# Patient Record
Sex: Male | Born: 2000 | Race: White | Hispanic: No | Marital: Single | Smoking: Never smoker
Health system: Southern US, Community
[De-identification: ages and names within clinical notes are randomized; demographics above are authoritative.]

---
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
## 2018-06-22 IMAGING — DX DG HAND COMPLETE 3+V*R*
3 series · 3 of 3 positions shown · non-contrast
Comparison: None.

CLINICAL DATA: Patient fell downstairs injuring right hand.

EXAM:
RIGHT HAND - COMPLETE 3+ VIEW

[hand pa]
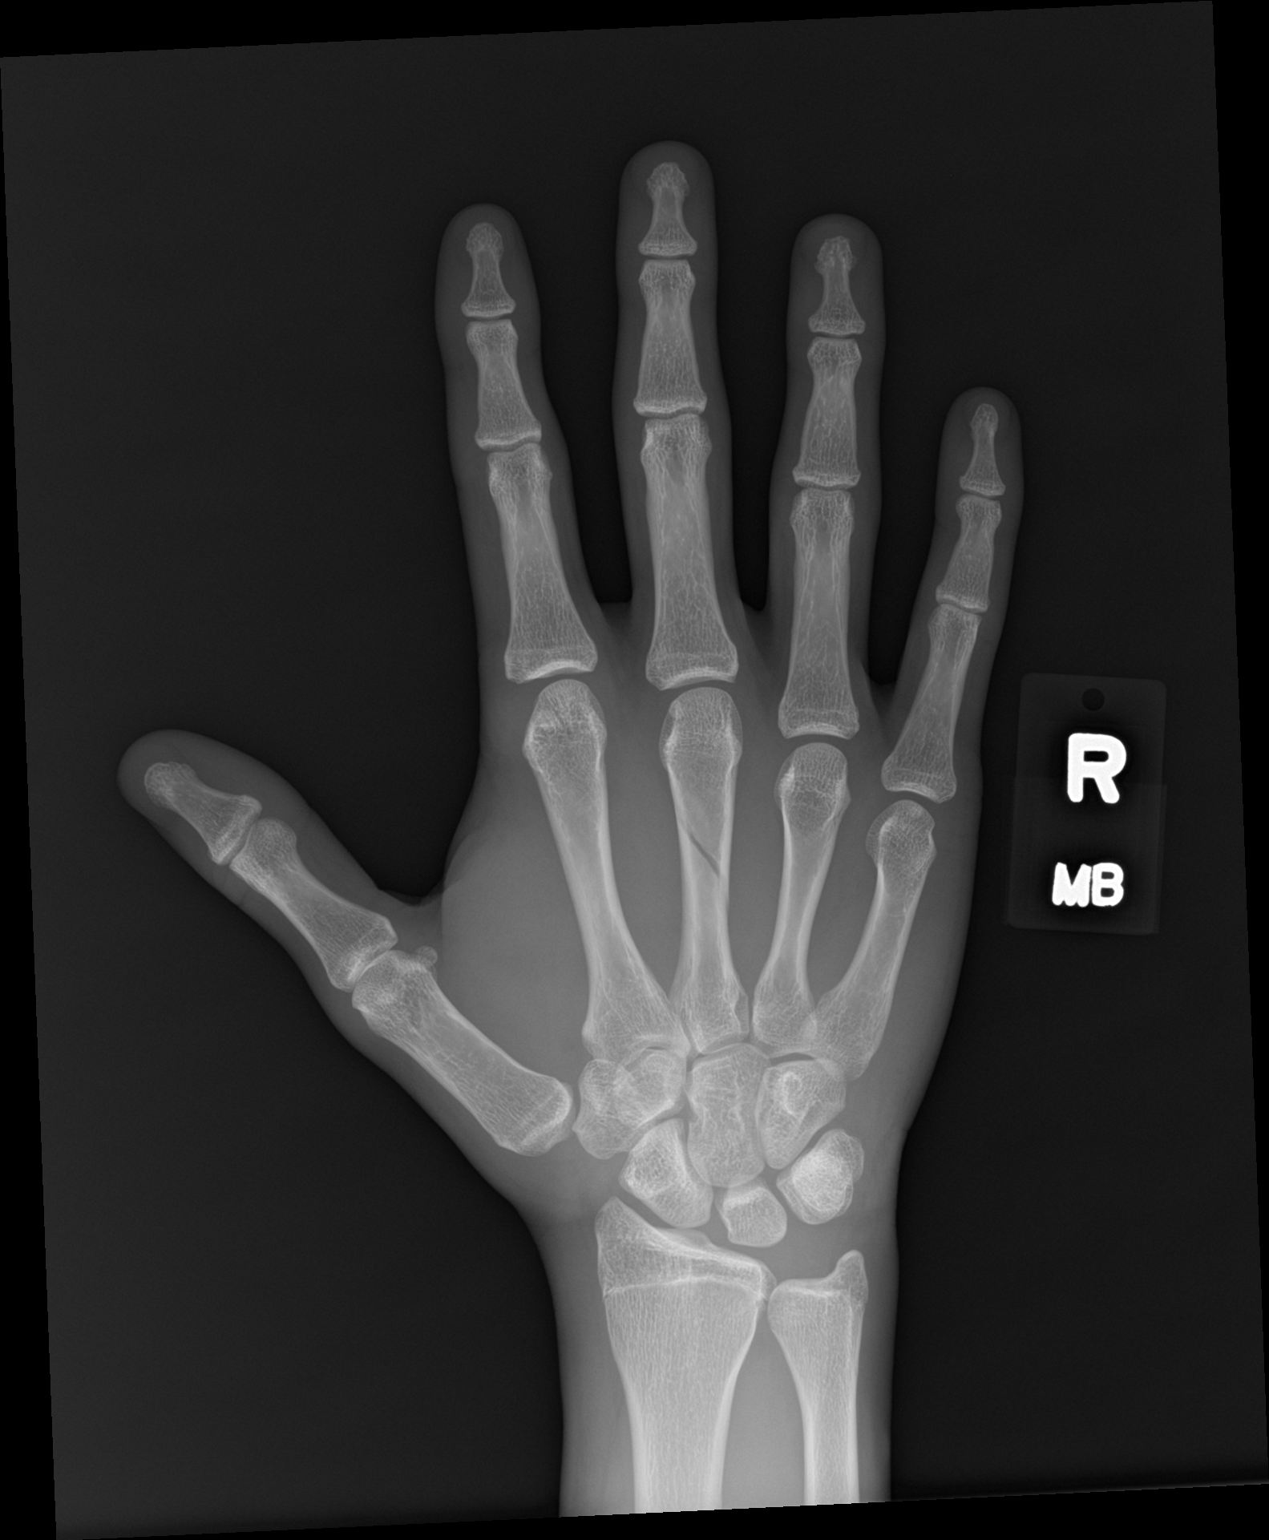

[hand obl]
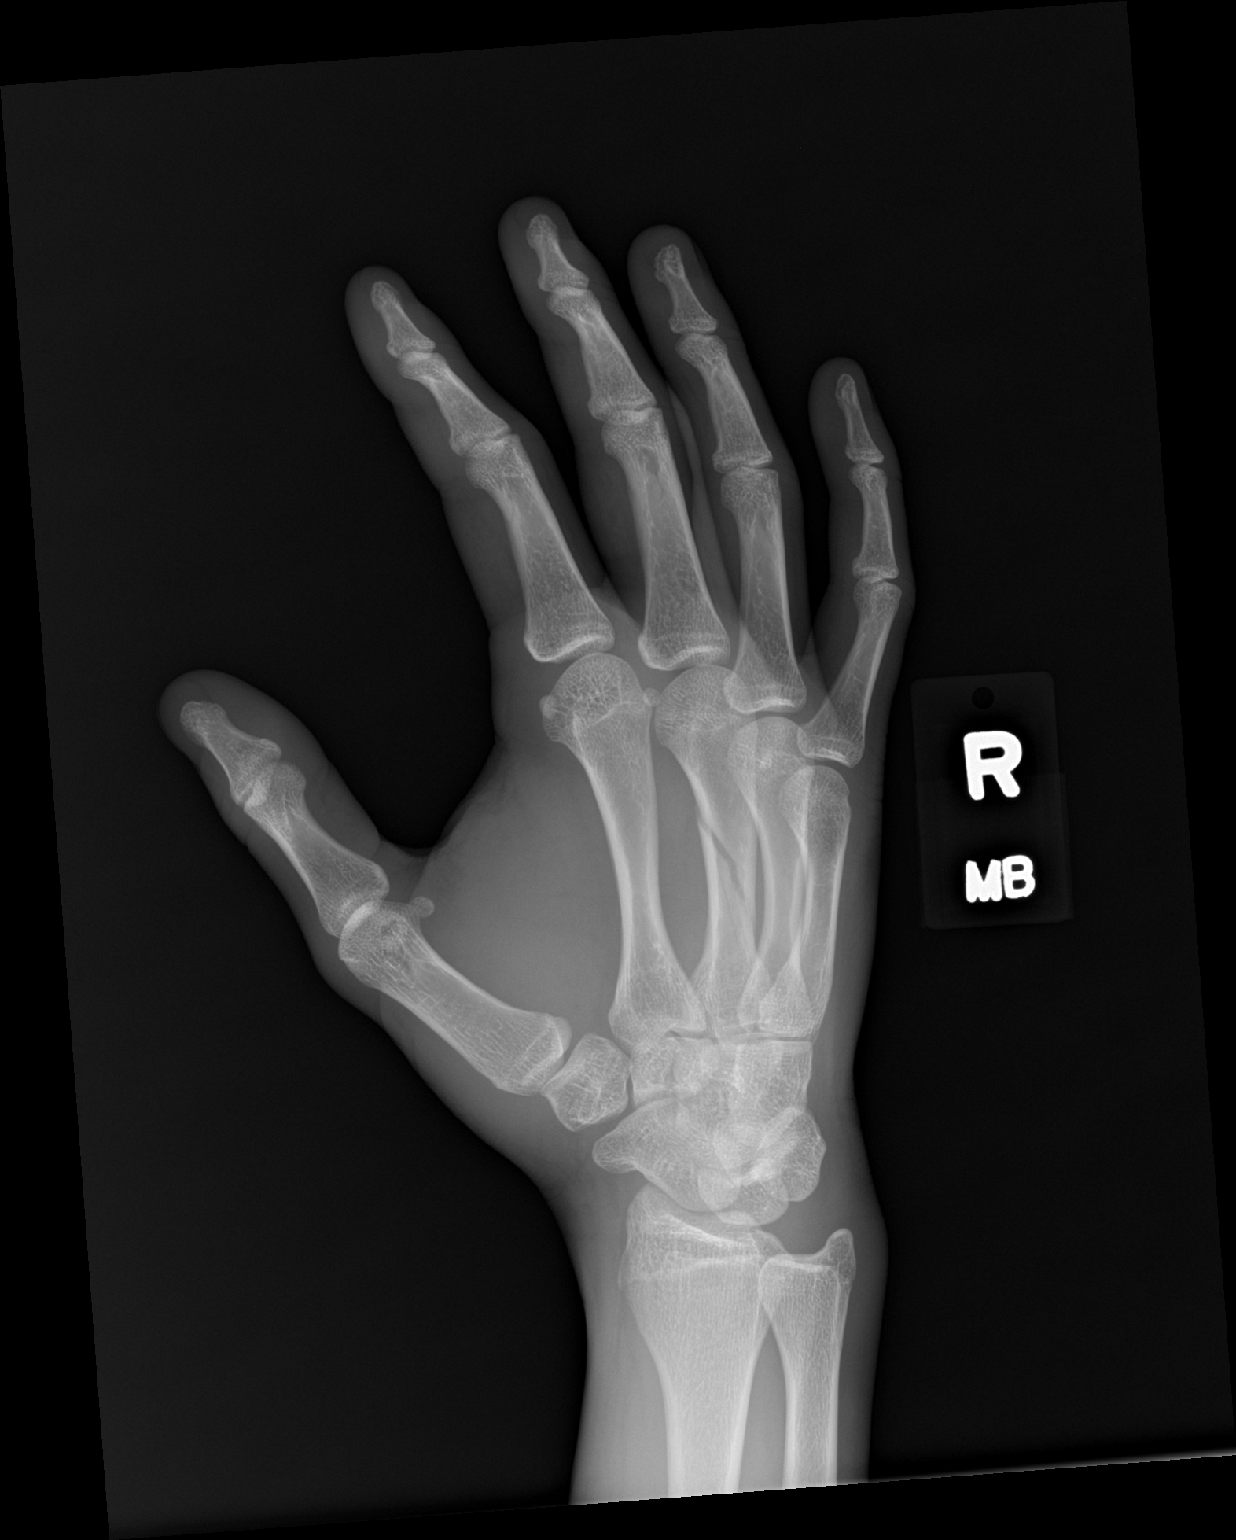

[hand lat]
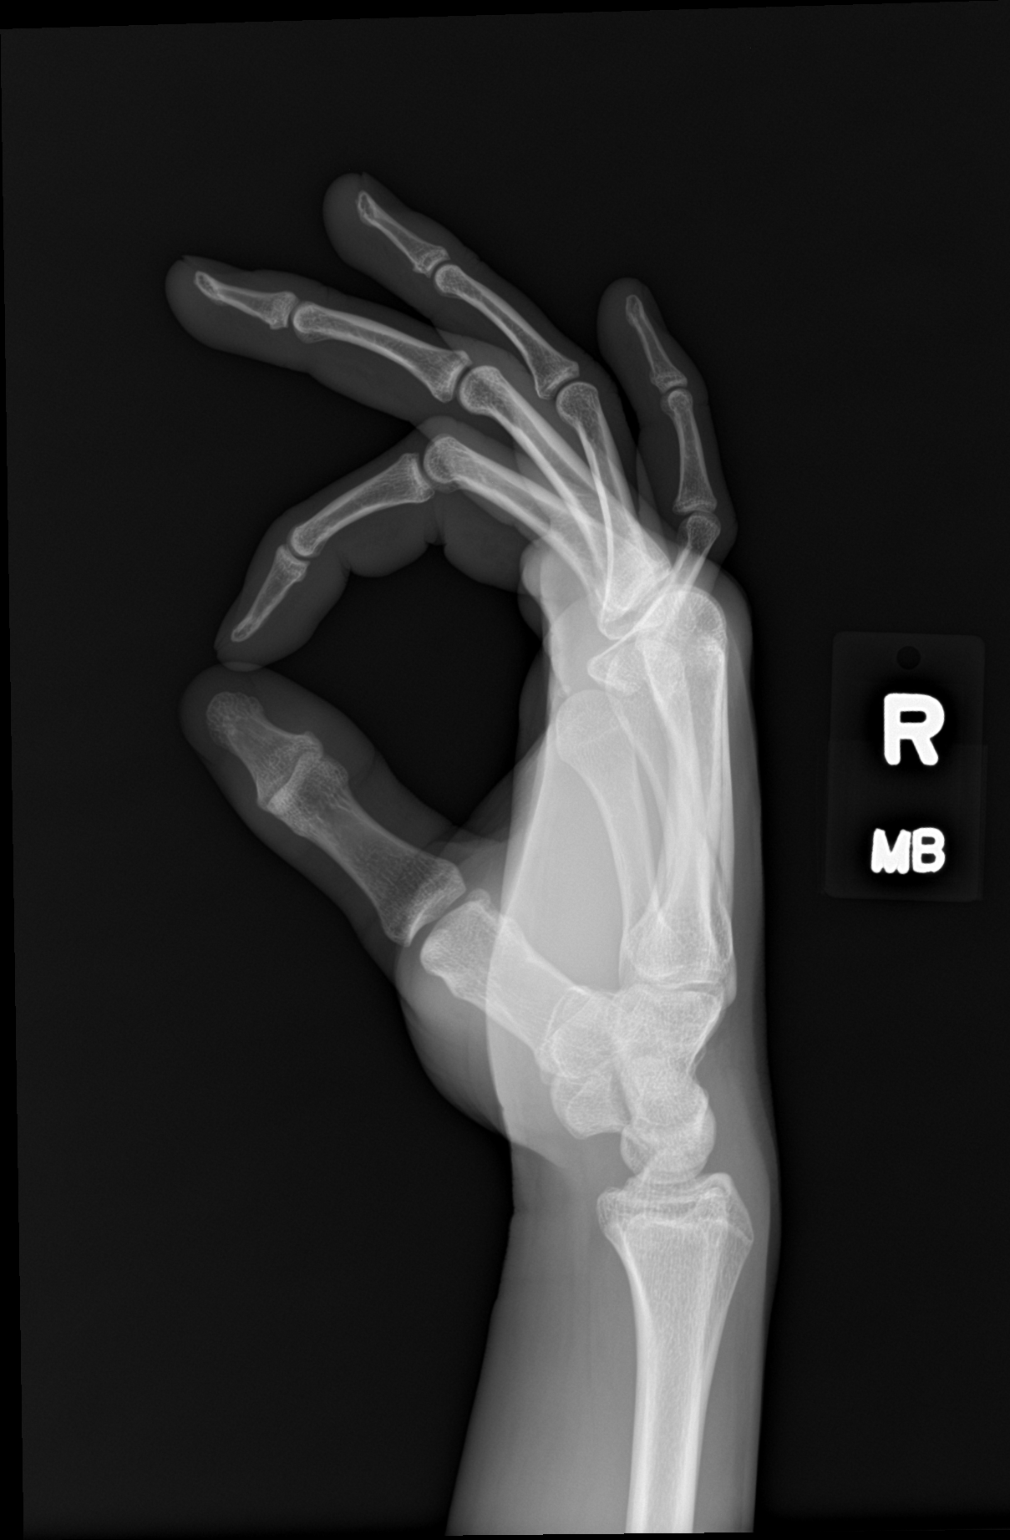

[3 of 3 positions shown; findings below may reference images not displayed]

FINDINGS: There is an acute, nondisplaced closed spiral fracture of the third
metacarpal shaft. No joint dislocations are identified. Slight
widened appearance of the scapholunate interval to 4 mm, top-normal
in size. Carpal bones are intact without fracture.
IMPRESSION: 1. Acute nondisplaced closed spiral fracture of the third metacarpal
shaft.
2. Borderline widened appearance of the scapholunate interval.

## 2018-06-22 IMAGING — DX DG WRIST COMPLETE 3+V*R*
3 series · 3 of 3 positions shown · non-contrast
Comparison: None.

CLINICAL DATA: Patient fell down stairs today.  Wrist pain.

EXAM:
RIGHT WRIST - COMPLETE 3+ VIEW

[wrist pa]
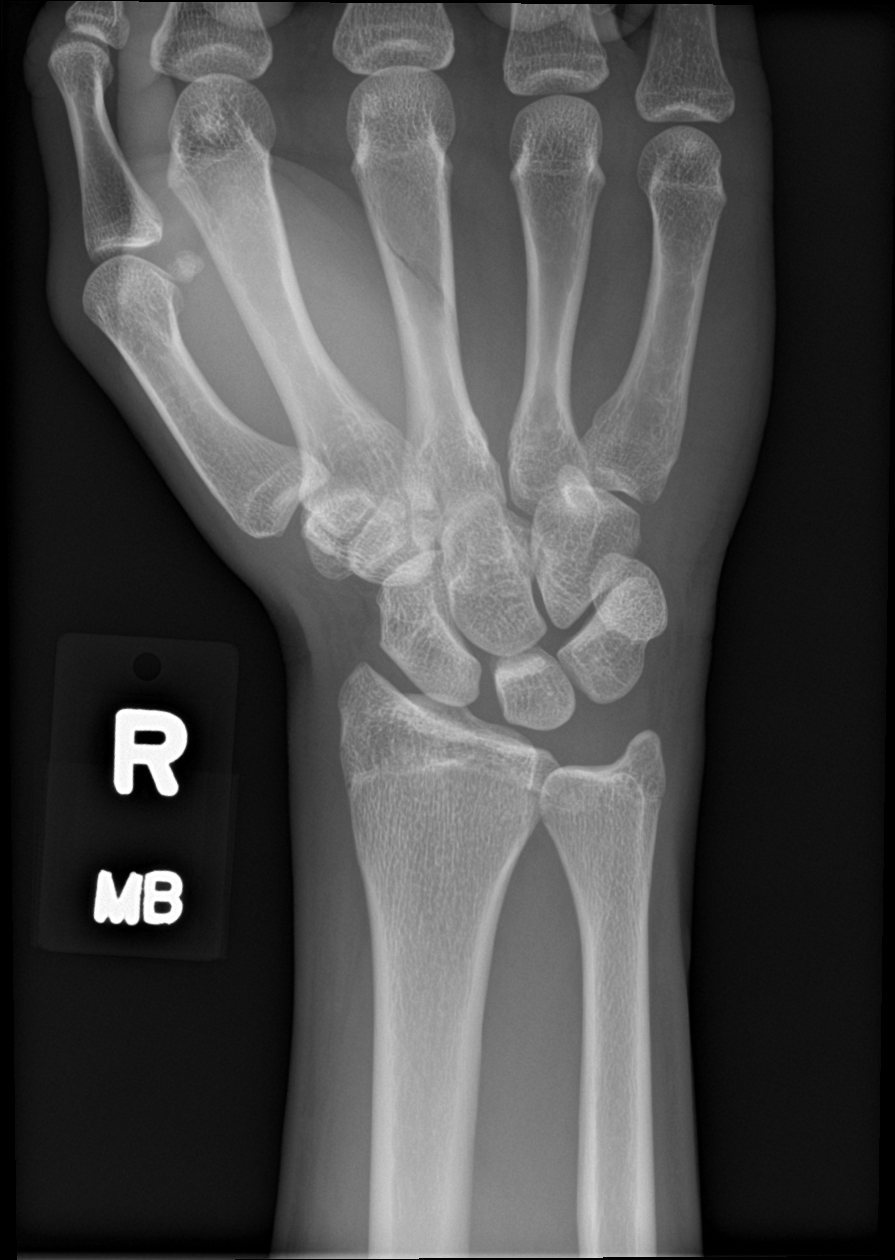

[wrist obl]
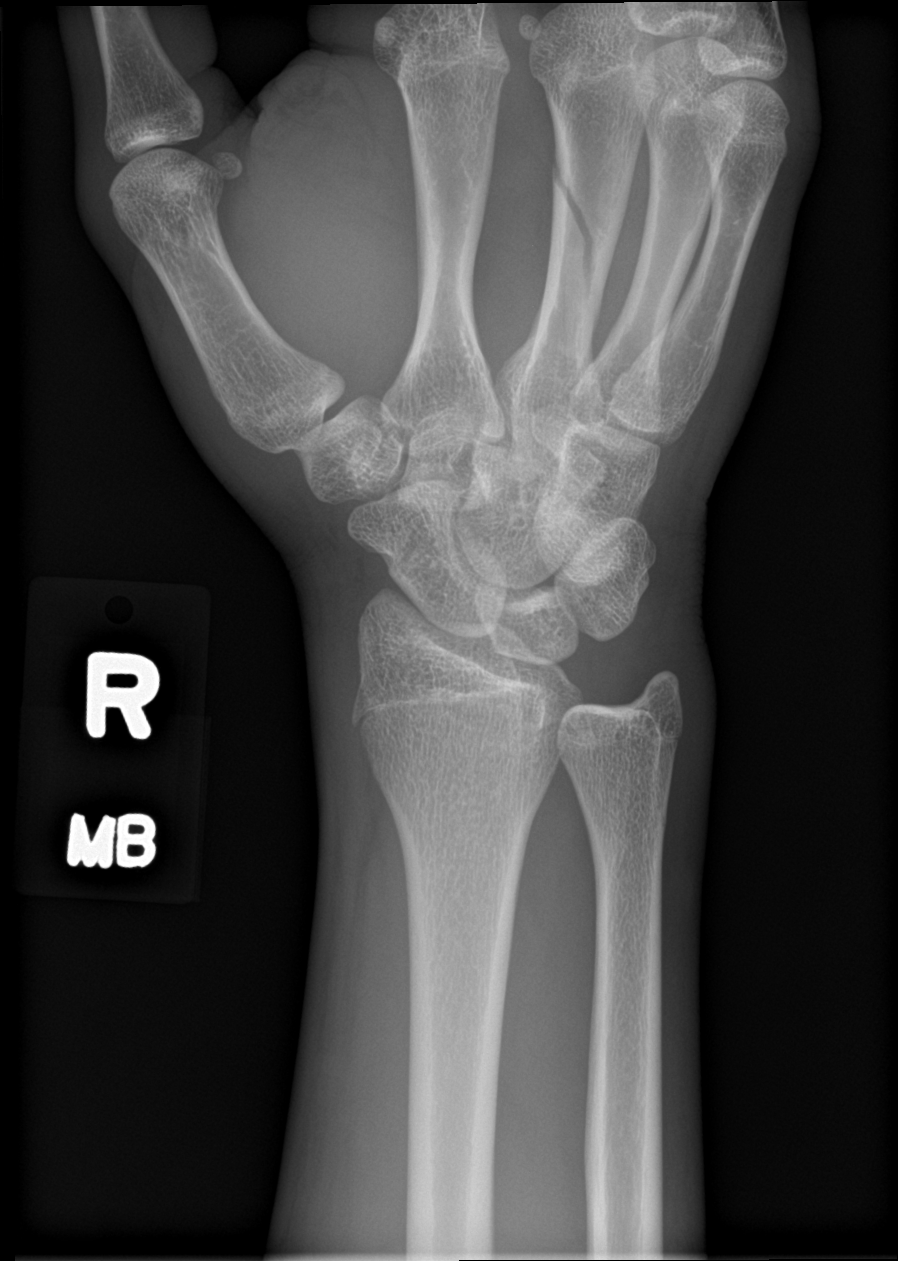

[wrist lat]
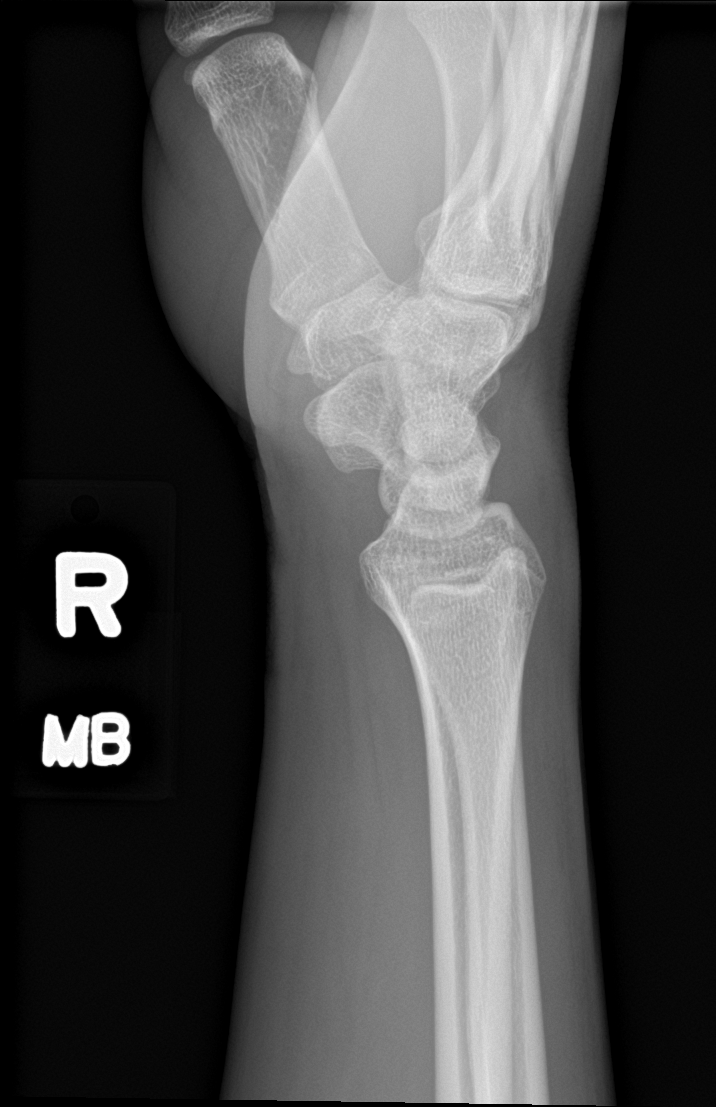

[3 of 3 positions shown; findings below may reference images not displayed]

FINDINGS: There is an acute, nondisplaced closed spiral fracture of the right
third metacarpal. Joint spaces are maintained. Borderline widened
appearance of the scapholunate interval up to 4 mm which is
top-normal. No joint dislocations. The distal radius and ulna are
nonacute.
IMPRESSION: Acute, closed, nondisplaced spiral fracture of the third metacarpal.
